# Patient Record
Sex: Female | Born: 1975 | Race: Black or African American | Hispanic: No | Marital: Single
Health system: Southern US, Community
[De-identification: ages and names within clinical notes are randomized; demographics above are authoritative.]

---

## 2020-02-21 ENCOUNTER — Other Ambulatory Visit: Payer: Self-pay

## 2020-02-21 ENCOUNTER — Emergency Department (HOSPITAL_COMMUNITY)
Admission: EM | Admit: 2020-02-21 | Discharge: 2020-02-21 | Disposition: A | Discharge status: Home / Self Care | Payer: 59 | Attending: Emergency Medicine | Admitting: Emergency Medicine

## 2020-02-21 ENCOUNTER — Emergency Department (HOSPITAL_COMMUNITY): Payer: 59

## 2020-02-21 ENCOUNTER — Encounter (HOSPITAL_COMMUNITY): Payer: Self-pay | Admitting: Emergency Medicine

## 2020-02-21 DIAGNOSIS — F419 Anxiety disorder, unspecified: Secondary | ICD-10-CM | POA: Insufficient documentation

## 2020-02-21 DIAGNOSIS — T7802XA Anaphylactic reaction due to shellfish (crustaceans), initial encounter: Secondary | ICD-10-CM | POA: Diagnosis not present

## 2020-02-21 DIAGNOSIS — R079 Chest pain, unspecified: Secondary | ICD-10-CM | POA: Insufficient documentation

## 2020-02-21 DIAGNOSIS — R0602 Shortness of breath: Secondary | ICD-10-CM | POA: Insufficient documentation

## 2020-02-21 DIAGNOSIS — R55 Syncope and collapse: Secondary | ICD-10-CM | POA: Diagnosis not present

## 2020-02-21 LAB — BASIC METABOLIC PANEL WITH GFR
Anion gap: 11 (ref 5–15)
BUN: 6 mg/dL (ref 6–20)
CO2: 25 mmol/L (ref 22–32)
Calcium: 9.1 mg/dL (ref 8.9–10.3)
Chloride: 102 mmol/L (ref 98–111)
Creatinine, Ser: 0.69 mg/dL (ref 0.44–1.00)
GFR, Estimated: >60 mL/min
Glucose, Bld: 99 mg/dL (ref 70–99)
Potassium: 3.5 mmol/L (ref 3.5–5.1)
Sodium: 138 mmol/L (ref 135–145)

## 2020-02-21 LAB — CBG MONITORING, ED: Glucose-Capillary: 87 mg/dL (ref 70–99)

## 2020-02-21 LAB — CBC WITH DIFFERENTIAL/PLATELET
Abs Immature Granulocytes: 0.02 K/uL (ref 0.00–0.07)
Basophils Absolute: 0 K/uL (ref 0.0–0.1)
Basophils Relative: 0 %
Eosinophils Absolute: 0.1 K/uL (ref 0.0–0.5)
Eosinophils Relative: 1 %
HCT: 42 % (ref 36.0–46.0)
Hemoglobin: 13.2 g/dL (ref 12.0–15.0)
Immature Granulocytes: 0 %
Lymphocytes Relative: 44 %
Lymphs Abs: 3.2 K/uL (ref 0.7–4.0)
MCH: 27.3 pg (ref 26.0–34.0)
MCHC: 31.4 g/dL (ref 30.0–36.0)
MCV: 86.8 fL (ref 80.0–100.0)
Monocytes Absolute: 0.5 K/uL (ref 0.1–1.0)
Monocytes Relative: 7 %
Neutro Abs: 3.4 K/uL (ref 1.7–7.7)
Neutrophils Relative %: 48 %
Platelets: 334 K/uL (ref 150–400)
RBC: 4.84 MIL/uL (ref 3.87–5.11)
RDW: 13.8 % (ref 11.5–15.5)
WBC: 7.2 K/uL (ref 4.0–10.5)
nRBC: 0 % (ref 0.0–0.2)

## 2020-02-21 LAB — CBC
HCT: 41.7 % (ref 36.0–46.0)
Hemoglobin: 13.3 g/dL (ref 12.0–15.0)
MCH: 27.8 pg (ref 26.0–34.0)
MCHC: 31.9 g/dL (ref 30.0–36.0)
MCV: 87.2 fL (ref 80.0–100.0)
Platelets: 354 K/uL (ref 150–400)
RBC: 4.78 MIL/uL (ref 3.87–5.11)
RDW: 13.9 % (ref 11.5–15.5)
WBC: 7.1 K/uL (ref 4.0–10.5)
nRBC: 0 % (ref 0.0–0.2)

## 2020-02-21 LAB — TROPONIN I (HIGH SENSITIVITY): Troponin I (High Sensitivity): <2 ng/L

## 2020-02-21 MED ORDER — HYDROXYZINE HCL 25 MG PO TABS: 25.0000 mg | ORAL_TABLET | Freq: Four times a day (QID) | ORAL | 0 refills | Status: AC | PRN

## 2020-02-21 MED ORDER — HYDROXYZINE HCL 25 MG PO TABS
50.0000 mg | ORAL_TABLET | Freq: Once | ORAL | Status: AC
  Administered 2020-02-21: 50 mg via ORAL
  Filled 2020-02-21: qty 2

## 2020-02-21 NOTE — ED Triage Notes (Signed)
Onset Thursday patient family cooked shellfish and patient was in car with family who ate shellfish for an hour. Patient had a syncope event and states family told her she passed out a couple of times. Took benadryl on Thursday however still feels like her throat is closing today with chest pain. Upon arrival patient felt very weak and felt she was going to pass out.

## 2020-02-21 NOTE — Discharge Instructions (Signed)
Your labs, chest x-ray and EKG are reassuring today. Take atarax as needed as prescribed if symptoms return. Do NOT take Benadryl (these medications are similar).  Follow up with your doctor. Return to the ER for worsening or concerning symptoms.

## 2020-02-21 NOTE — ED Provider Notes (Signed)
[facility] EMERGENCY DEPARTMENT Provider Note   CSN: [id number] Arrival date & time: 02/21/20  1153     History Chief Complaint  Patient presents with  . Chest Pain  . Allergic Reaction  . Loss of Consciousness    Jaime Mckee is a 44 y.o. female.  44 year old female with history of hypertension and shellfish allergy presents with complaint of shortness of breath and chest pain from an allergic reaction. Patient states she was in the car for an hour on Thursday with people how had eaten shell fish when she developed chest pain and shortness of breath, voice change and passed out several times. Patient took benadryl and symptoms improved but have been intermittent for the past 4 days. Patient last took benadryl yesterday, last had hives 2 days ago.         History reviewed. No pertinent past medical history.  There are no problems to display for this patient.   History reviewed. No pertinent surgical history.   OB History   No obstetric history on file.     No family history on file.  Social History   Tobacco Use  . Smoking status: Not on file  Substance Use Topics  . Alcohol use: Not on file  . Drug use: Not on file    Home Medications Prior to Admission medications   Medication Sig Start Date End Date Taking? Authorizing Provider  acetaminophen (TYLENOL) 500 MG tablet Take 500 mg by mouth every 6 (six) hours as needed for mild pain.   Yes [provider]  Biotin w/ Vitamins C & E (HAIR/SKIN/NAILS PO) Take 1 tablet by mouth daily.   Yes [provider]  calcium-vitamin D (OSCAL WITH D) 500-200 MG-UNIT tablet Take 1 tablet by mouth daily.   Yes [provider]  Cyanocobalamin (VITAMIN B-12 PO) Take 1 tablet by mouth daily.   Yes [provider]  diphenhydrAMINE (SOMINEX) 25 MG tablet Take 25 mg by mouth at bedtime as needed for sleep (For allergic reaction).   Yes [provider]  esomeprazole  (NEXIUM) 40 MG capsule Take 40 mg by mouth daily. 12/29/19  Yes [provider]  Multiple Vitamin (MULTIVITAMIN WITH MINERALS) TABS tablet Take 1 tablet by mouth daily.   Yes [provider]  Multiple Vitamins-Minerals (ZINC PO) Take 1 tablet by mouth daily.   Yes [provider]  Pyridoxine HCl (VITAMIN B-6 PO) Take 1 tablet by mouth daily.   Yes [provider]  sertraline (ZOLOFT) 100 MG tablet Take 150 mg by mouth daily.  12/29/19  Yes [provider]  hydrOXYzine (ATARAX/VISTARIL) 25 MG tablet Take 1 tablet (25 mg total) by mouth every 6 (six) hours as needed for anxiety or itching. 02/21/20   Jeannie Fend, PA-C    Allergies    Shellfish allergy  Review of Systems   Review of Systems  Constitutional: Negative for chills and fever.  HENT: Positive for voice change.   Respiratory: Positive for cough and shortness of breath.   Cardiovascular: Positive for chest pain.  Gastrointestinal: Negative for abdominal pain, diarrhea, nausea and vomiting.  Musculoskeletal: Negative for arthralgias and myalgias.  Skin: Positive for rash.  Allergic/Immunologic: Positive for food allergies. Negative for immunocompromised state.  Neurological: Positive for weakness.  Psychiatric/Behavioral: Negative for confusion.  All other systems reviewed and are negative.   Physical Exam Updated Vital Signs BP 125/84   Pulse 66   Temp 98.6 F (37 C) (Oral)  Resp 17   Ht 5\' 4"  (1.626 m)   Wt 117.9 kg   LMP  (LMP Unknown)   SpO2 100%   BMI 44.63 kg/m   Physical Exam Vitals and nursing note reviewed.  Constitutional:      Appearance: She is well-developed. She is not diaphoretic.  HENT:     Head: Normocephalic and atraumatic.     Mouth/Throat:     Pharynx: Oropharynx is clear. Uvula midline. No pharyngeal swelling, posterior oropharyngeal erythema or uvula swelling.  Cardiovascular:     Rate and Rhythm: Normal rate and regular rhythm.     Heart  sounds: Normal heart sounds.  Pulmonary:     Effort: Pulmonary effort is normal. Tachypnea present.     Breath sounds: Normal breath sounds. No decreased breath sounds or wheezing.  Abdominal:     Palpations: Abdomen is soft.     Tenderness: There is no abdominal tenderness.  Musculoskeletal:     Cervical back: Neck supple.     Right lower leg: No edema.     Left lower leg: No edema.  Skin:    General: Skin is warm and dry.     Findings: No erythema or rash.  Neurological:     Mental Status: She is alert and oriented to person, place, and time.  Psychiatric:        Mood and Affect: Mood is anxious.     ED Results / Procedures / Treatments   Labs (all labs ordered are listed, but only abnormal results are displayed) Labs Reviewed  BASIC METABOLIC PANEL  CBC  CBC WITH DIFFERENTIAL/PLATELET  TROPONIN I (HIGH SENSITIVITY)    EKG EKG Interpretation  Date/Time:  Monday February 21 2020 12:08:10 EST Ventricular Rate:  78 PR Interval:    QRS Duration: 96 QT Interval:  378 QTC Calculation: 431 R Axis:   55 Text Interpretation: Sinus arrhythmia Confirmed by 10[birth year]01-2002 [phone]) on 02/21/2020 12:20:34 PM   Radiology DG Chest Port 1 View  Result Date: 02/21/2020 CLINICAL DATA:  Chest pain and shortness of breath with throat and chest tightness. Shell fish allergy with possible recent exposure. EXAM: PORTABLE CHEST 1 VIEW COMPARISON:  None. FINDINGS: Lungs are adequately inflated without consolidation or effusion. Cardiomediastinal silhouette, bones and soft tissues are unremarkable. IMPRESSION: No active disease. Electronically Signed   By: 02/23/2020 M.D.   On: 02/21/2020 12:46    Procedures Procedures (including critical care time)  Medications Ordered in ED Medications  hydrOXYzine (ATARAX/VISTARIL) tablet 50 mg (50 mg Oral Given 02/21/20 1228)    ED Course  I have reviewed the triage vital signs and the nursing notes.  Pertinent labs & imaging results  that were available during my care of the patient were reviewed by me and considered in my medical decision making (see chart for details).  Clinical Course as of Feb 20 1350  Mon Feb 21, 2020  1317 44 yo female presents as above, on exam, patient is tachypenic, lungs clear, oropharynx clear. Patient was given hydroxyzine, on recheck, states she is feeling much better, symptoms have resolved.  EKG without ischemic changes, cxr normal, CBC WNL. Plan is to monitor patient while awaiting results and recheck.    [LM]  1348 Patient continues to be asymptomatic. Reviewed normal labs, CXR, ekg with patient. Advised to recheck with PCP, return to ER as needed. Given rx for atrax as needed if symptoms return.    [LM]    Clinical Course User Index [LM] 55,  PA-C   MDM Rules/Calculators/A&P                          Final Clinical Impression(s) / ED Diagnoses Final diagnoses:  Shortness of breath    Rx / DC Orders ED Discharge Orders         Ordered    hydrOXYzine (ATARAX/VISTARIL) 25 MG tablet  Every 6 hours PRN        02/21/20 1345           Jeannie Fend, PA-C 02/21/20 1351    Benjiman Core, MD 02/21/20 1606

## 2020-02-21 NOTE — ED Notes (Signed)
Pt's CBG result was 87. Informed Greg - RN.

## 2021-02-12 IMAGING — DX DG CHEST 1V PORT
1 series · 1 of 1 positions shown · non-contrast
Comparison: None.

CLINICAL DATA: Chest pain and shortness of breath with throat and
chest tightness. Shell fish allergy with possible recent exposure.

EXAM:
PORTABLE CHEST 1 VIEW

[chest ap]
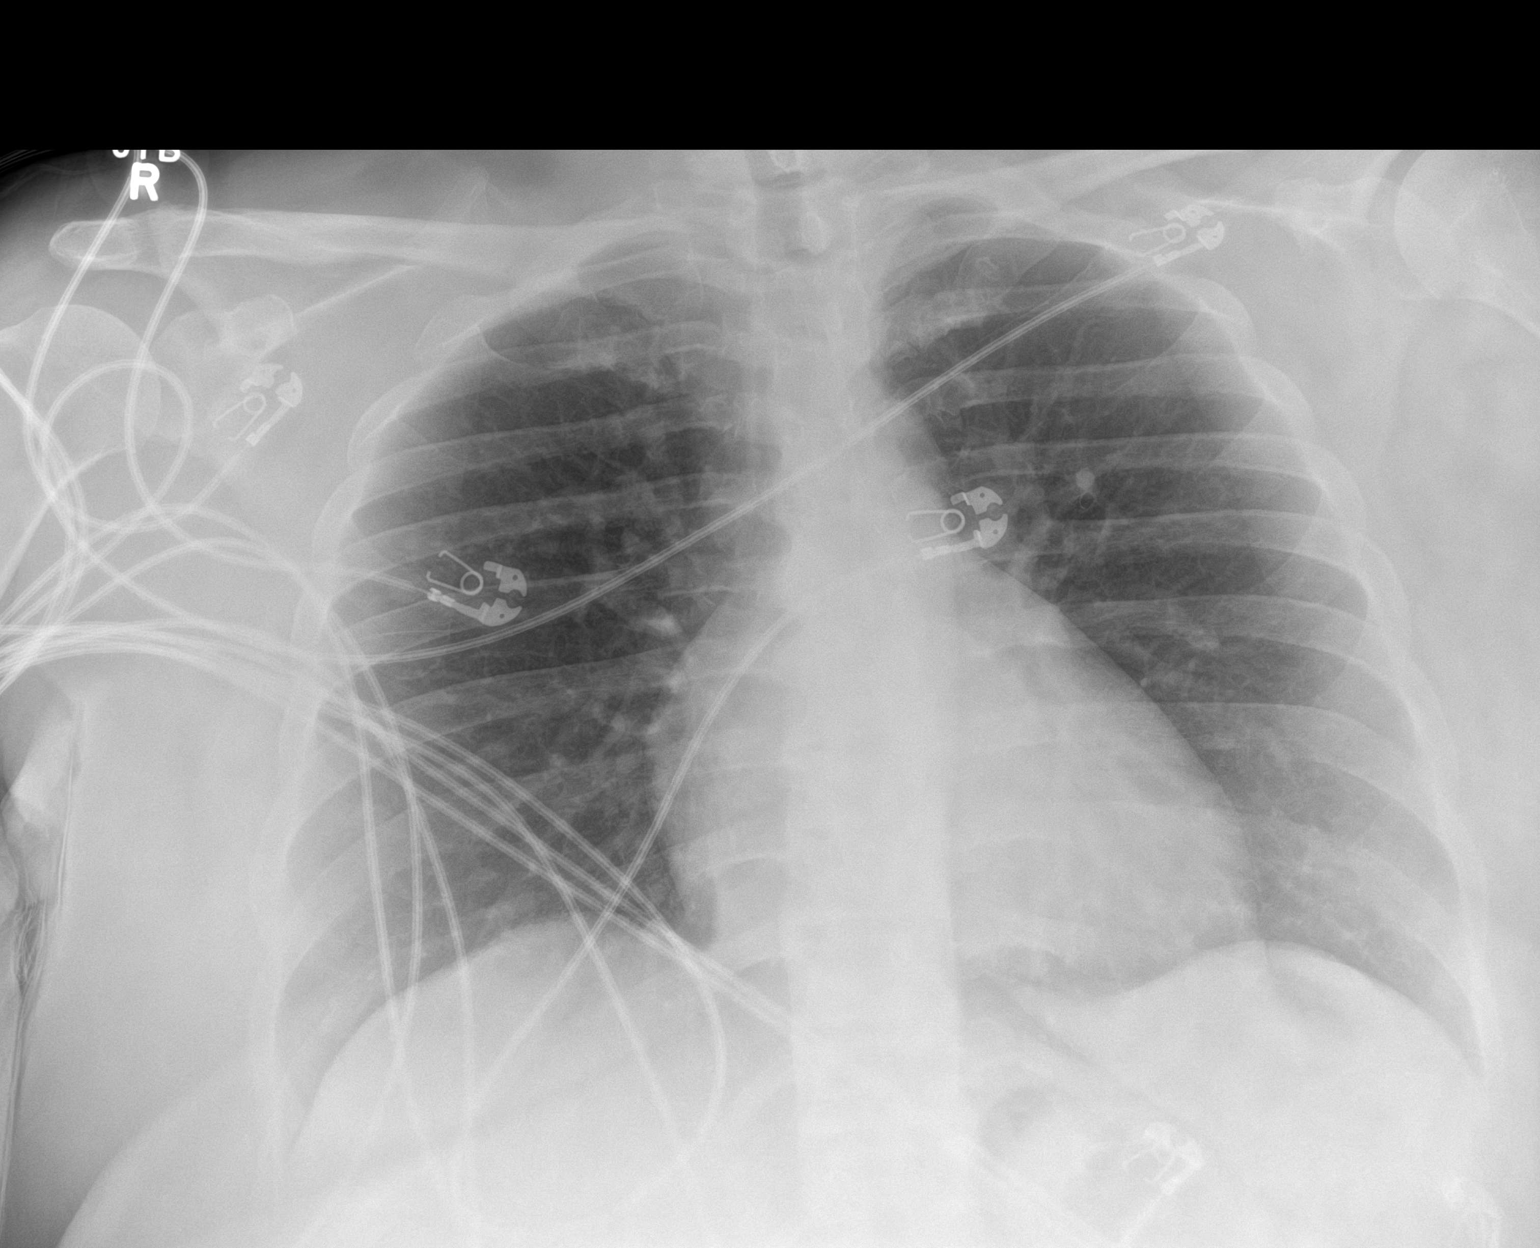

[1 of 1 positions shown; findings below may reference images not displayed]

FINDINGS: Lungs are adequately inflated without consolidation or effusion.
Cardiomediastinal silhouette, bones and soft tissues are
unremarkable.
IMPRESSION: No active disease.

## 2022-04-29 ENCOUNTER — Ambulatory Visit: Payer: 59 | Admitting: Nurse Practitioner

## 2022-07-14 ENCOUNTER — Emergency Department (HOSPITAL_BASED_OUTPATIENT_CLINIC_OR_DEPARTMENT_OTHER): Payer: BLUE CROSS/BLUE SHIELD | Admitting: Radiology

## 2022-07-14 ENCOUNTER — Other Ambulatory Visit: Payer: Self-pay

## 2022-07-14 ENCOUNTER — Emergency Department (HOSPITAL_BASED_OUTPATIENT_CLINIC_OR_DEPARTMENT_OTHER)
Admission: EM | Admit: 2022-07-14 | Discharge: 2022-07-14 | Disposition: A | Discharge status: Home / Self Care | Payer: BLUE CROSS/BLUE SHIELD | Attending: Emergency Medicine | Admitting: Emergency Medicine

## 2022-07-14 DIAGNOSIS — M542 Cervicalgia: Secondary | ICD-10-CM | POA: Insufficient documentation

## 2022-07-14 DIAGNOSIS — Y92410 Unspecified street and highway as the place of occurrence of the external cause: Secondary | ICD-10-CM | POA: Diagnosis not present

## 2022-07-14 DIAGNOSIS — S39012A Strain of muscle, fascia and tendon of lower back, initial encounter: Secondary | ICD-10-CM

## 2022-07-14 DIAGNOSIS — M546 Pain in thoracic spine: Secondary | ICD-10-CM | POA: Diagnosis not present

## 2022-07-14 DIAGNOSIS — M545 Low back pain, unspecified: Secondary | ICD-10-CM | POA: Insufficient documentation

## 2022-07-14 DIAGNOSIS — S161XXA Strain of muscle, fascia and tendon at neck level, initial encounter: Secondary | ICD-10-CM

## 2022-07-14 MED ORDER — NAPROXEN 500 MG PO TABS: 500.0000 mg | ORAL_TABLET | Freq: Two times a day (BID) | ORAL | 0 refills | Status: AC

## 2022-07-14 MED ORDER — CYCLOBENZAPRINE HCL 10 MG PO TABS: 10.0000 mg | ORAL_TABLET | Freq: Two times a day (BID) | ORAL | 0 refills | Status: AC | PRN
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
## 2022-07-14 NOTE — Discharge Instructions (Signed)
Begin taking naproxen as prescribed.  Begin taking Flexeril as prescribed as needed for pain not relieved with naproxen.  Follow-up with primary doctor if symptoms are not improving in the next week.
